# Patient Record
Sex: Male | Born: 1990 | Race: Black or African American | Hispanic: No | Marital: Single | Smoking: Never smoker
Health system: Southern US, Community
[De-identification: ages and names within clinical notes are randomized; demographics above are authoritative.]

## PROBLEM LIST (undated history)

## (undated) DIAGNOSIS — J45909 Unspecified asthma, uncomplicated: Secondary | ICD-10-CM

---

## 2016-08-13 ENCOUNTER — Encounter: Payer: Self-pay | Admitting: *Deleted

## 2016-08-13 ENCOUNTER — Ambulatory Visit
Admission: EM | Admit: 2016-08-13 | Discharge: 2016-08-13 | Disposition: A | Discharge status: Home / Self Care | Payer: Self-pay | Attending: Family Medicine | Admitting: Family Medicine

## 2016-08-13 DIAGNOSIS — Z024 Encounter for examination for driving license: Secondary | ICD-10-CM

## 2016-08-13 DIAGNOSIS — Z0289 Encounter for other administrative examinations: Secondary | ICD-10-CM

## 2016-08-13 LAB — DEPT OF TRANSP DIPSTICK, URINE (ARMC ONLY)
Glucose, UA: NEGATIVE mg/dL
HGB URINE DIPSTICK: NEGATIVE
Specific Gravity, Urine: >1.03 — ABNORMAL HIGH (ref 1.005–1.030)

## 2016-08-13 NOTE — ED Provider Notes (Signed)
[facility]    CSN: [id number] Arrival date & time: 08/13/16  1505     History   Chief Complaint Chief Complaint  Patient presents with  . Commercial Driver's License Exam    HPI Saif Peter is a 26 y.o. male.   Patient here for DOT Physical (see scanned form)   The history is provided by the patient.    History reviewed. No pertinent past medical history.  There are no active problems to display for this patient.   History reviewed. No pertinent surgical history.     Home Medications    Prior to Admission medications   Not on File    Family History History reviewed. No pertinent family history.  Social History Social History  Substance Use Topics  . Smoking status: Never Smoker  . Smokeless tobacco: Never Used  . Alcohol use No     Allergies   Patient has no known allergies.   Review of Systems Review of Systems   Physical Exam Triage Vital Signs ED Triage Vitals  Enc Vitals Group     BP 08/13/16 1534 117/73     Pulse Rate 08/13/16 1534 79     Resp 08/13/16 1534 16     Temp 08/13/16 1534 98.6 F (37 C)     Temp Source 08/13/16 1534 Oral     SpO2 08/13/16 1534 100 %     Weight 08/13/16 1537 170 lb (77.1 kg)     Height 08/13/16 1537 6\' 2"  (1.88 m)     Head Circumference --      Peak Flow --      Pain Score --      Pain Loc --      Pain Edu? --      Excl. in GC? --    No data found.   Updated Vital Signs BP 117/73 (BP Location: Left Arm)   Pulse 79   Temp 98.6 F (37 C) (Oral)   Resp 16   Ht 6\' 2"  (1.88 m)   Wt 170 lb (77.1 kg)   SpO2 100%   BMI 21.83 kg/m   Visual Acuity Right Eye Distance:   Left Eye Distance:   Bilateral Distance:    Right Eye Near:   Left Eye Near:    Bilateral Near:     Physical Exam   UC Treatments / Results  Labs (all labs ordered are listed, but only abnormal results are displayed) Labs Reviewed  DEPT OF TRANSP DIPSTICK, URINE([facility] ONLY) - Abnormal;  Notable for the following:       Result Value   Protein, ur TRACE (*)    Specific Gravity, Urine >1.030 (*)    All other components within normal limits    EKG  EKG Interpretation None       Radiology No results found.  Procedures Procedures (including critical care time)  Medications Ordered in UC Medications - No data to display   Initial Impression / Assessment and Plan / UC Course  I have reviewed the triage vital signs and the nursing notes.  Pertinent labs & imaging results that were available during my care of the patient were reviewed by me and considered in my medical decision making (see chart for details).       Final Clinical Impressions(s) / UC Diagnoses   Final diagnoses:  Encounter for examination required by Department of Transportation (DOT)    New Prescriptions New Prescriptions   No medications on file   DOT Physical (  medically qualified for 2 year; see scanned form)   Payton Mccallumonty, Nakiyah Beverley, MD 08/13/16 [phone]

## 2016-08-13 NOTE — ED Triage Notes (Signed)
DOT Physical

## 2016-11-20 ENCOUNTER — Encounter: Payer: Self-pay | Admitting: *Deleted

## 2016-11-20 ENCOUNTER — Ambulatory Visit: Payer: Self-pay

## 2016-11-20 ENCOUNTER — Ambulatory Visit (INDEPENDENT_AMBULATORY_CARE_PROVIDER_SITE_OTHER): Payer: Self-pay

## 2016-11-20 ENCOUNTER — Ambulatory Visit
Admission: EM | Admit: 2016-11-20 | Discharge: 2016-11-20 | Disposition: A | Discharge status: Home / Self Care | Payer: Self-pay | Attending: Physician Assistant | Admitting: Physician Assistant

## 2016-11-20 DIAGNOSIS — S6991XA Unspecified injury of right wrist, hand and finger(s), initial encounter: Secondary | ICD-10-CM

## 2016-11-20 DIAGNOSIS — L03012 Cellulitis of left finger: Secondary | ICD-10-CM

## 2016-11-20 DIAGNOSIS — S60031A Contusion of right middle finger without damage to nail, initial encounter: Secondary | ICD-10-CM

## 2016-11-20 DIAGNOSIS — W230XXA Caught, crushed, jammed, or pinched between moving objects, initial encounter: Secondary | ICD-10-CM

## 2016-11-20 HISTORY — DX: Unspecified asthma, uncomplicated: J45.909

## 2016-11-20 MED ORDER — NAPROXEN 500 MG PO TABS
500.0000 mg | ORAL_TABLET | Freq: Two times a day (BID) | ORAL | 0 refills | Status: AC
Start: 2016-11-20 — End: ?

## 2016-11-20 MED ORDER — SULFAMETHOXAZOLE-TRIMETHOPRIM 800-160 MG PO TABS: 1.0000 | ORAL_TABLET | Freq: Two times a day (BID) | ORAL | 0 refills | Status: AC

## 2016-11-20 NOTE — ED Triage Notes (Signed)
Pt closed car door on right middle finger 1 week ago. Distal portion of finger edematous and discolored.

## 2016-11-20 NOTE — ED Provider Notes (Addendum)
[facility]    CSN: [id number] Arrival date & time: 11/20/16  1658   History   Chief Complaint Chief Complaint  Patient presents with  . Finger Injury    HPI Chad Howard is a 26 y.o. male.   Patient is a 26 year old male who presents with complaint of left middle finger pain and swelling. Patient states that he closed his finger in the door to his room little over a week ago. He states that a few days before that he injured his finger but he is not sure how he injured it. Patient states his finger has been swollen for a little over a week and that has not improved. He reports that his finger will hurt if he hits it against something or while using it. He states that he has tried ibuprofen, acetaminophen, and Excedrin which have helped some with the pain but not with any swelling. The patient states he has not had his finger evaluated. 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      Past Medical History:  Diagnosis Date  . Asthma     There are no active problems to display for this patient.   History reviewed. No pertinent surgical history.     Home Medications    Prior to Admission medications   Medication Sig Start Date End Date Taking? Authorizing Provider  naproxen (NAPROSYN) 500 MG tablet Take 1 tablet (500 mg total) by mouth 2 (two) times daily. 11/20/16   Candis SchatzHarris, Bryen Hinderman D, PA-C  sulfamethoxazole-trimethoprim (BACTRIM DS,SEPTRA DS) 800-160 MG tablet Take 1 tablet by mouth 2 (two) times daily. 11/20/16 11/27/16  Candis SchatzHarris, Karington Zarazua D, PA-C    Family History History reviewed. No pertinent family history.  Social History Social History  Substance Use Topics  . Smoking status: Never Smoker  . Smokeless tobacco: Never Used  . Alcohol use No     Allergies   Patient has no known allergies.   Review of Systems Review of Systems As noted above in history of present illness. Other systems reviewed and found to be  negative  Physical Exam Triage Vital Signs ED Triage Vitals  Enc Vitals Group     BP 11/20/16 1722 111/80     Pulse Rate 11/20/16 1722 68     Resp 11/20/16 1722 16     Temp 11/20/16 1722 98.6 F (37 C)     Temp Source 11/20/16 1722 Oral     SpO2 11/20/16 1722 100 %     Weight 11/20/16 1723 180 lb (81.6 kg)     Height 11/20/16 1723 6\' 2"  (1.88 m)     Head Circumference --      Peak Flow --      Pain Score 11/20/16 1724 7     Pain Loc --      Pain Edu? --      Excl. in GC? --    No data found.   Updated Vital Signs BP 111/80 (BP Location: Left Arm)   Pulse 68   Temp 98.6 F (37 C) (Oral)   Resp 16   Ht 6\' 2"  (1.88 m)   Wt 180 lb (81.6 kg)   SpO2 100%   BMI 23.11 kg/m   Visual Acuity Right Eye Distance:   Left Eye Distance:   Bilateral Distance:    Right Eye Near:   Left Eye Near:    Bilateral Near:     Physical Exam  Constitutional: He is oriented to  person, place, and time. He appears well-developed and well-nourished. No distress.  HENT:  Head: Normocephalic and atraumatic.  Eyes: Pupils are equal, round, and reactive to light. EOM are normal.  Neck: Normal range of motion.  Cardiovascular: Normal rate and regular rhythm.   Pulmonary/Chest: Effort normal.  Musculoskeletal:       Right hand: He exhibits tenderness, bony tenderness and swelling. He exhibits normal capillary refill and no laceration. Normal sensation noted.       Hands: Swelling over distal phalange of left middle finger. Some discoloration of skin. Tenderness to palpation of distal phalnges.   Neurological: He is alert and oriented to person, place, and time.  Skin: Skin is warm and dry. Capillary refill takes less than 2 seconds.     UC Treatments / Results  Labs (all labs ordered are listed, but only abnormal results are displayed) Labs Reviewed - No data to display  EKG  EKG Interpretation None       Radiology Dg Hand Complete Right  Result Date: 11/20/2016 CLINICAL DATA:   Crush injury in car door 2 weeks ago. Pain and swelling. EXAM: RIGHT HAND - COMPLETE 3+ VIEW COMPARISON:  None. FINDINGS: Soft tissue swelling of the distal long finger. No evidence of fracture, dislocation or radiopaque foreign object. Cortical irregularity of the medial margin of the distal tuft of the long finger could be posttraumatic osteolysis or conceivably evidence of osteomyelitis. IMPRESSION: Soft tissue swelling of the distal long finger. No evidence of fracture. Cortical irregularity of the medial margin of the distal tuft. This could be posttraumatic osteolysis or conceivably evidence of infection. Electronically Signed   By: Paulina Fusi M.D.   On: 11/20/2016 18:23    Procedures .Marland KitchenIncision and Drainage Date/Time: 11/20/2016 7:07 PM Performed by: Sherrlyn Hock D Authorized by: Sherrlyn Hock D   Consent:    Consent obtained:  Verbal   Consent given by:  Patient Location:    Indications for incision and drainage: paronychia L middle finger. Pre-procedure details:    Procedure prep: chlorhexadine. Anesthesia (see MAR for exact dosages):    Anesthesia method:  None Procedure type:    Complexity:  Simple Procedure details:    Needle aspiration: no     Incision types:  Stab incision   Incision depth:  Dermal   Scalpel blade:  11   Drainage:  Bloody and purulent   Drainage amount:  Moderate   Wound treatment:  Wound left open Post-procedure details:    Patient tolerance of procedure:  Tolerated well, no immediate complications   (including critical care time)  Medications Ordered in UC Medications - No data to display   Initial Impression / Assessment and Plan / UC Course  I have reviewed the triage vital signs and the nursing notes.  Pertinent labs & imaging results that were available during my care of the patient were reviewed by me and considered in my medical decision making (see chart for details).    Concern for fracture vs contusion based on exam.  Final  Clinical Impressions(s) / UC Diagnoses   Final diagnoses:  Contusion of right middle finger without damage to nail, initial encounter  Injury of finger of right hand, initial encounter  Paronychia of left middle finger    New Prescriptions New Prescriptions   NAPROXEN (NAPROSYN) 500 MG TABLET    Take 1 tablet (500 mg total) by mouth 2 (two) times daily.   SULFAMETHOXAZOLE-TRIMETHOPRIM (BACTRIM DS,SEPTRA DS) 800-160 MG TABLET    Take 1 tablet  by mouth 2 (two) times daily.    Controlled Substance Prescriptions [place] Controlled Substance Registry consulted? Not Applicable   Patient status post injury to his left middle finger, closing it in a bedroom door below her week ago with an unknown previous injury a few days prior. X-ray as above. Discussed patient with Dr. Ernest Pine, on-call orthopedist, in regards to the radiology reading. Less likely osteomyelitis given no penetrating injury and no fever. The irregularity could also be indicative of an irregular tuft fracture. Recommend splinting for comfort and also ice elevation. Upon reevaluation prior to this discharge, finger reexamined and edema also consistent with paronychia. I&D done with drainage of a moderate amount of purulent and bloody fluid. Patient with reported relief afterwards. Bandage placed. Recommended for warm soapy soaks tonight at least 4 times a day and to keep the wound clean. Wound care instructions provided. Patient given a prescription for Bactrim twice a day for 7 days as well as naproxen sodium for pain. Patient can follow with Dr. Ernest Pine next week if his symptoms are not improved. Patient verbalized understanding is removed with the plan  Candis Schatz, PA-C    Candis Schatz, PA-C 11/20/16 1841    Candis Schatz, PA-C 11/20/16 1912

## 2016-11-20 NOTE — Discharge Instructions (Signed)
-  RICE:see attached instructions -bactrim: one tablet twice a day for 7 days. -warm, soapy water soaks four times a day. -keep finger clean and covered while working and being active. -would buddy-tape with ring finger and splint while working or being active -naproxen: twice a day as needed for pain  -can also use acetaminophen for pain -follow up with Dr. Ernest PineHooten next week if pain and swelling continue.

## 2018-08-15 IMAGING — CR DG HAND COMPLETE 3+V*R*
3 series · 3 of 3 positions shown · non-contrast
Comparison: None.

CLINICAL DATA: Crush injury in car door 2 weeks ago. Pain and
swelling.

EXAM:
RIGHT HAND - COMPLETE 3+ VIEW

[hand ap]
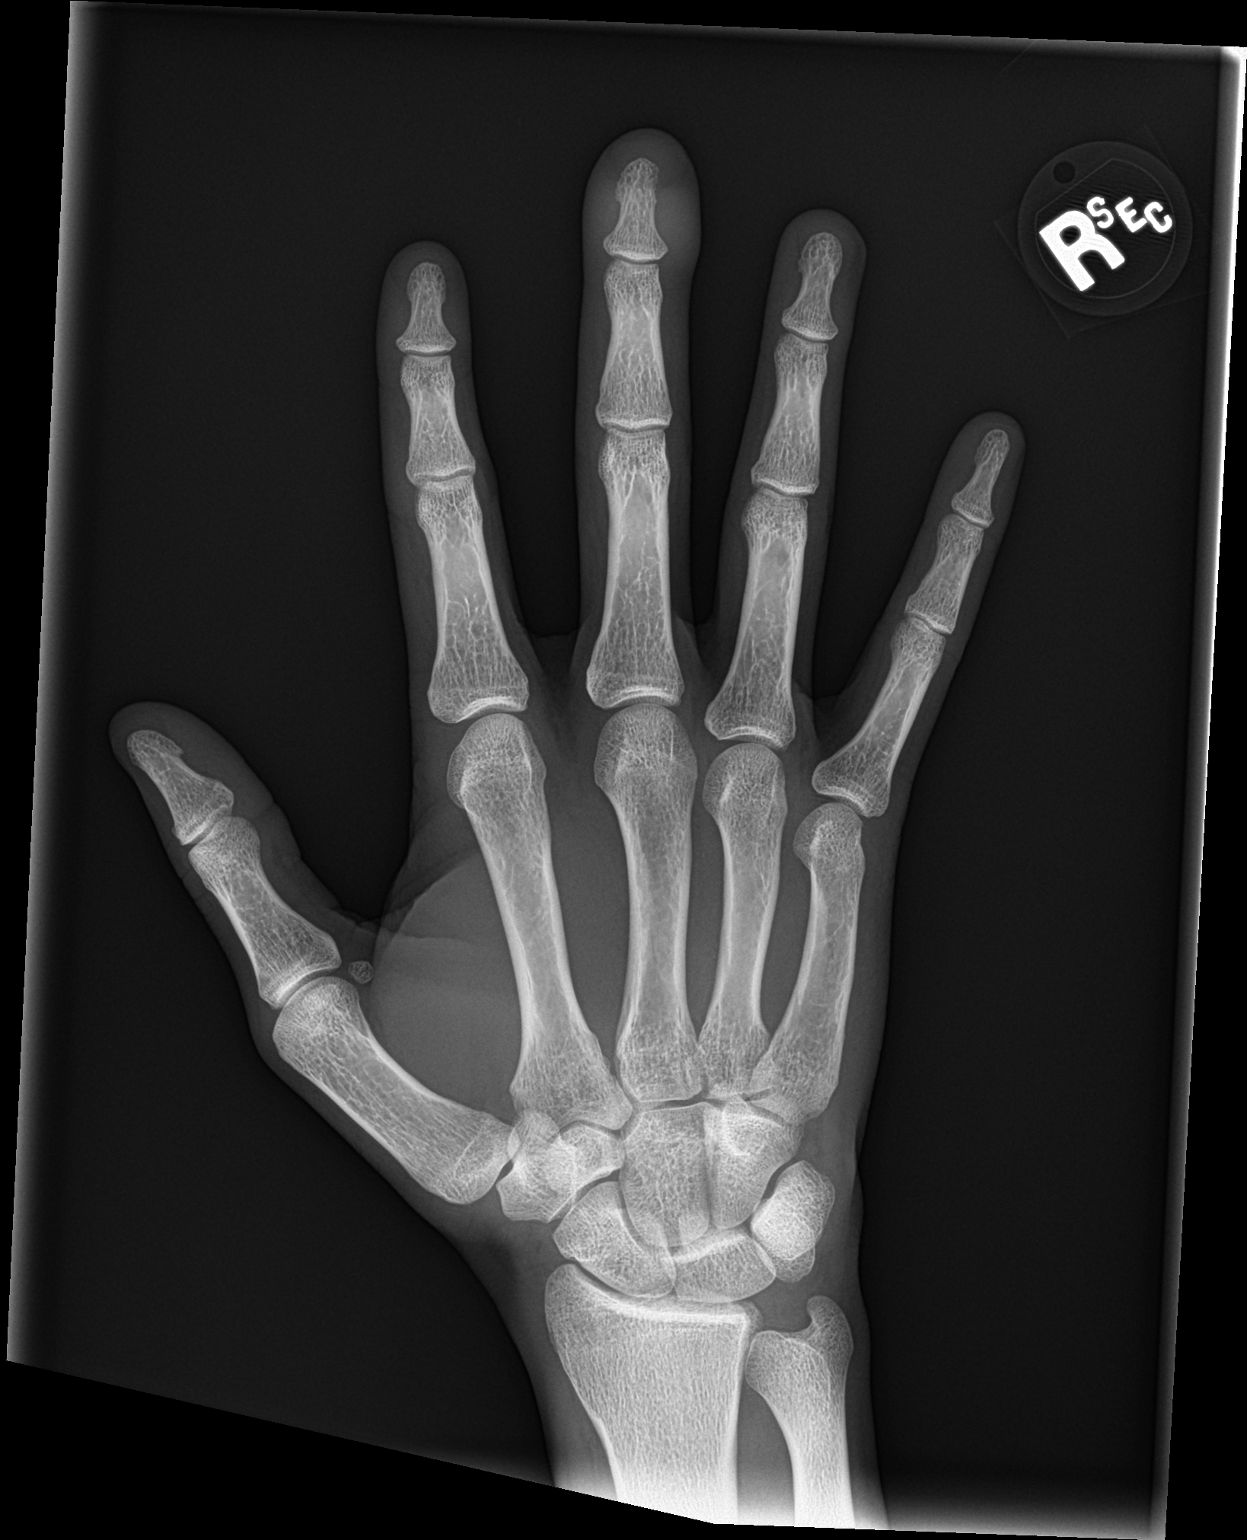

[hand obl]
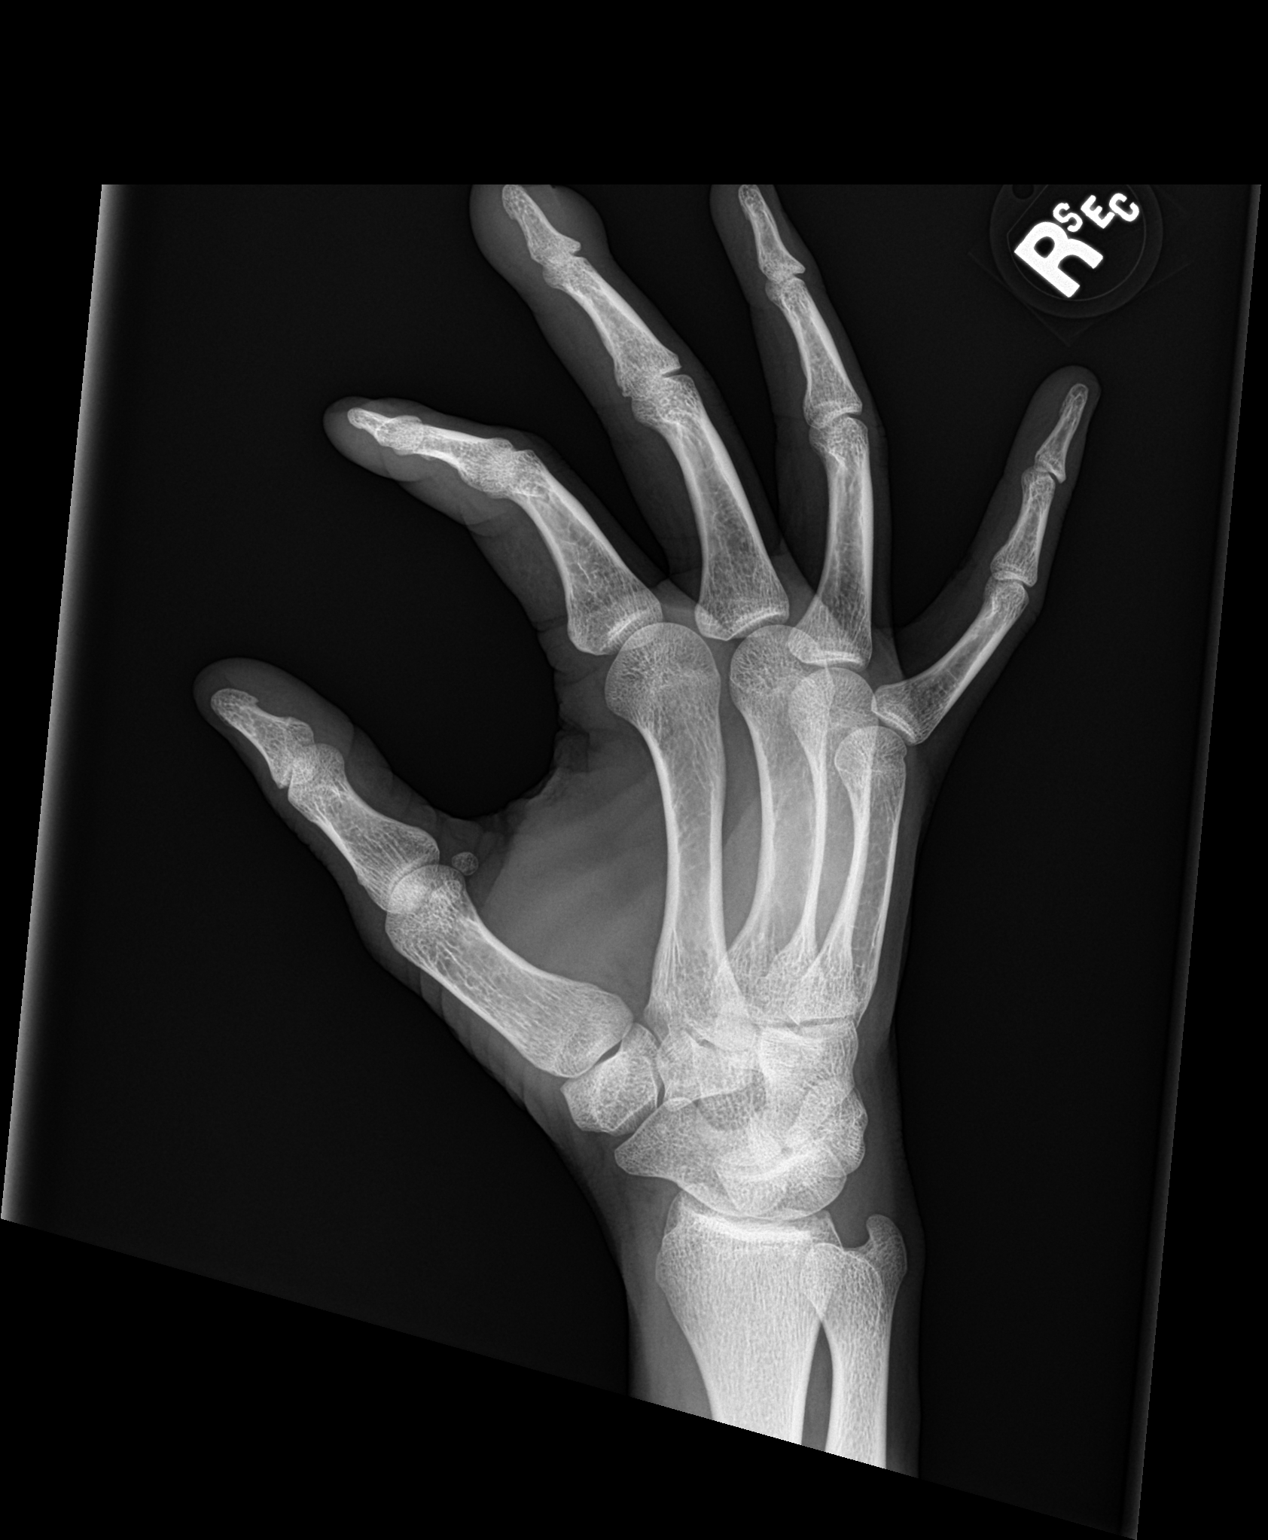

[hand lat]
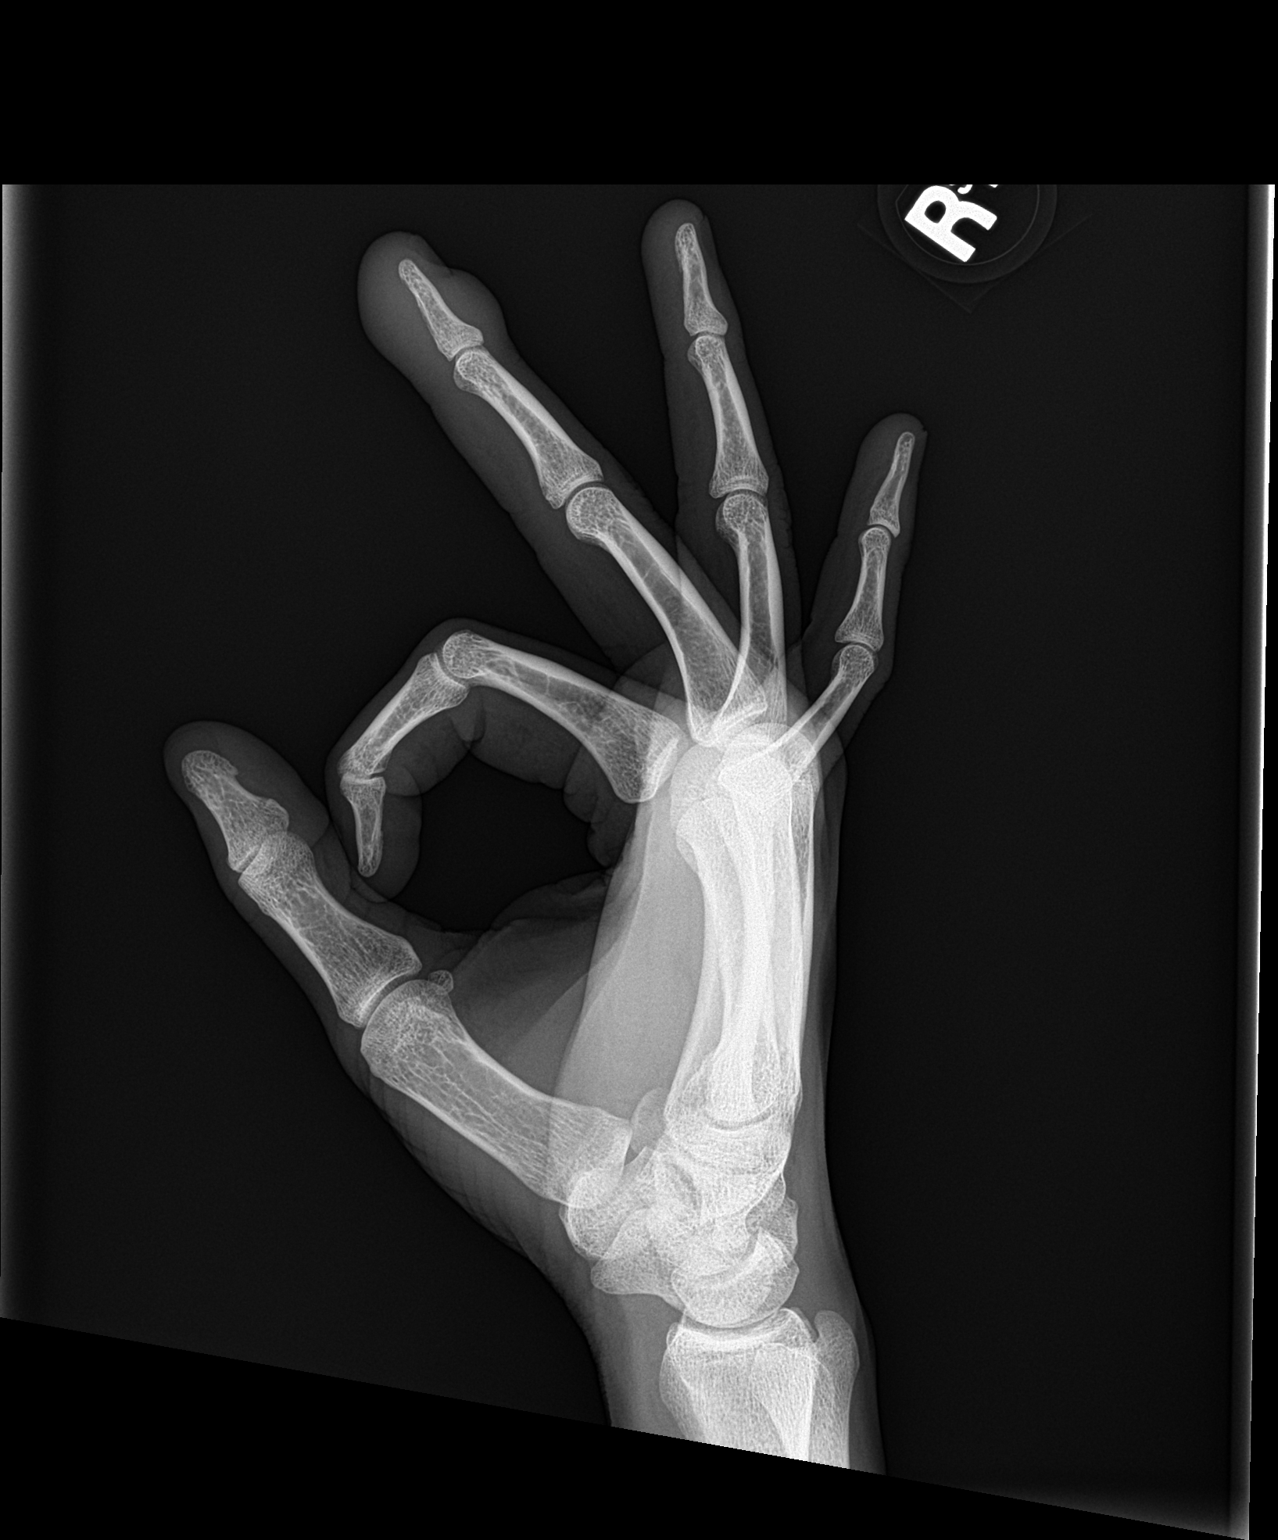

[3 of 3 positions shown; findings below may reference images not displayed]

FINDINGS: Soft tissue swelling of the distal long finger. No evidence of
fracture, dislocation or radiopaque foreign object. Cortical
irregularity of the medial margin of the distal tuft of the long
finger could be posttraumatic osteolysis or conceivably evidence of
osteomyelitis.
IMPRESSION: Soft tissue swelling of the distal long finger. No evidence of
fracture. Cortical irregularity of the medial margin of the distal
tuft. This could be posttraumatic osteolysis or conceivably evidence
of infection.
